# Patient Record
Sex: Female | Born: 1950 | Race: White | Hispanic: No | Marital: Single | Smoking: Never smoker
Health system: Southern US, Community
[De-identification: ages and names within clinical notes are randomized; demographics above are authoritative.]

## PROBLEM LIST (undated history)

## (undated) HISTORY — PX: CHOLECYSTECTOMY: SHX55

---

## 1982-08-08 HISTORY — PX: SHOULDER SURGERY: SHX246

## 1993-08-08 HISTORY — PX: KNEE SURGERY: SHX244

## 2009-08-09 LAB — HM COLONOSCOPY

## 2011-01-01 ENCOUNTER — Emergency Department: Payer: Self-pay | Admitting: Emergency Medicine

## 2011-05-09 LAB — HM MAMMOGRAPHY: HM Mammogram: NORMAL

## 2014-09-25 LAB — HM PAP SMEAR: HM Pap smear: NEGATIVE

## 2015-01-27 ENCOUNTER — Other Ambulatory Visit: Payer: Self-pay | Admitting: Internal Medicine

## 2015-01-27 ENCOUNTER — Ambulatory Visit
Admission: RE | Admit: 2015-01-27 | Discharge: 2015-01-27 | Disposition: A | Discharge status: Home / Self Care | Payer: Federal, State, Local not specified - PPO | Source: Ambulatory Visit | Attending: Internal Medicine | Admitting: Internal Medicine

## 2015-01-27 ENCOUNTER — Ambulatory Visit (INDEPENDENT_AMBULATORY_CARE_PROVIDER_SITE_OTHER): Payer: Federal, State, Local not specified - PPO | Admitting: Internal Medicine

## 2015-01-27 ENCOUNTER — Encounter: Payer: Self-pay | Admitting: Internal Medicine

## 2015-01-27 VITALS — BP 120/64 | HR 77 | Temp 98.2°F | Ht 65.0 in | Wt 242.6 lb

## 2015-01-27 DIAGNOSIS — J189 Pneumonia, unspecified organism: Secondary | ICD-10-CM

## 2015-01-27 DIAGNOSIS — R059 Cough, unspecified: Secondary | ICD-10-CM

## 2015-01-27 DIAGNOSIS — Q792 Exomphalos: Secondary | ICD-10-CM | POA: Insufficient documentation

## 2015-01-27 DIAGNOSIS — R05 Cough: Secondary | ICD-10-CM | POA: Insufficient documentation

## 2015-01-27 DIAGNOSIS — R55 Syncope and collapse: Secondary | ICD-10-CM | POA: Insufficient documentation

## 2015-01-27 DIAGNOSIS — IMO0002 Reserved for concepts with insufficient information to code with codable children: Secondary | ICD-10-CM | POA: Insufficient documentation

## 2015-01-27 DIAGNOSIS — E785 Hyperlipidemia, unspecified: Secondary | ICD-10-CM | POA: Insufficient documentation

## 2015-01-27 DIAGNOSIS — Z86010 Personal history of colonic polyps: Secondary | ICD-10-CM | POA: Insufficient documentation

## 2015-01-27 MED ORDER — AMOXICILLIN-POT CLAVULANATE 875-125 MG PO TABS: 1.0000 | ORAL_TABLET | Freq: Two times a day (BID) | ORAL | Status: DC

## 2015-01-27 MED ORDER — BUDESONIDE 180 MCG/ACT IN AEPB: 1.0000 | INHALATION_SPRAY | Freq: Two times a day (BID) | RESPIRATORY_TRACT | Status: AC

## 2015-01-27 NOTE — Progress Notes (Signed)
Date:  01/27/2015   Name:  Gina Rosario   DOB:  1950/09/07   MRN:  [id number]   Chief Complaint: Cough Cough This is a new problem. The current episode started more than 1 month ago. The problem has been unchanged. The problem occurs every few hours. The cough is non-productive. Associated symptoms include chest pain. Pertinent negatives include no chills, fever, nasal congestion, postnasal drip, rhinorrhea, sore throat, shortness of breath or wheezing. The symptoms are aggravated by dust (talking worsens it). Risk factors for lung disease include occupational exposure (dust at home with some recent remodeling). Her past medical history is significant for environmental allergies. There is no history of asthma or COPD.     Review of Systems:  Review of Systems  Constitutional: Negative for fever and chills.  HENT: Negative for facial swelling, postnasal drip, rhinorrhea, sinus pressure, sneezing and sore throat.   Respiratory: Positive for cough and chest tightness. Negative for choking, shortness of breath and wheezing.   Cardiovascular: Positive for chest pain.  Gastrointestinal: Negative for nausea and diarrhea.  Allergic/Immunologic: Positive for environmental allergies.    Patient Active Problem List   Diagnosis Date Noted  . Adult BMI 30+ 01/27/2015  . Dyslipidemia 01/27/2015  . History of colon polyps 01/27/2015  . Episode of syncope 01/27/2015  . 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 Authorizing Provider  aspirin 81 MG chewable tablet Chew 1 tablet by mouth daily.    Historical Provider, MD    Allergies no known allergies  Past Surgical History  Procedure Laterality Date  . Cholecystectomy    . Shoulder surgery Right 1984    arthroscopic  . Knee surgery  1995    arthroscopic    History  Substance Use Topics  . Smoking status: Never Smoker   . Smokeless tobacco: Not on file  . Alcohol Use: 1.2  oz/week    2 Standard drinks or equivalent per week     Medication list has been reviewed and updated.  Physical Examination:  Physical Exam  Constitutional: She is oriented to person, place, and time. She appears well-developed and well-nourished.  HENT:  Right Ear: No drainage. Tympanic membrane is not erythematous.  Left Ear: No drainage. Tympanic membrane is not erythematous.  Nose: Right sinus exhibits no maxillary sinus tenderness and no frontal sinus tenderness. Left sinus exhibits no maxillary sinus tenderness and no frontal sinus tenderness.  Mouth/Throat: Uvula is midline and oropharynx is clear and moist. No oral lesions. No oropharyngeal exudate or posterior oropharyngeal edema.  Eyes: Conjunctivae are normal.  Neck: Normal range of motion. Neck supple. No tracheal tenderness present. Carotid bruit is not present. No thyromegaly present.  Cardiovascular: Normal rate, regular rhythm and S1 normal.   Pulmonary/Chest: Effort normal. No accessory muscle usage. No respiratory distress. She has decreased breath sounds in the right upper field. She has no wheezes. She has no rhonchi.  Lymphadenopathy:    She has no cervical adenopathy.  Neurological: She is alert and oriented to person, place, and time.    BP 120/64 mmHg  Pulse 77  Temp(Src) 98.2 F (36.8 C)  Ht 5\' 5"  (1.651 m)  Wt 242 lb 9.6 oz (110.043 kg)  BMI 40.37 kg/m2  SpO2 95%  Assessment and Plan: 1. Cough Possible adult onset asthma Begin pulmicort (sample) - DG Chest 2 View; Future - budesonide (PULMICORT) 180 MCG/ACT inhaler; Inhale 1 puff into the lungs 2 (two) times daily.  Dispense: 1 each; Refill: 0   Bari Edward, MD [facility]  01/27/2015

## 2015-03-17 ENCOUNTER — Encounter: Payer: Self-pay | Admitting: Family Medicine

## 2015-03-17 ENCOUNTER — Ambulatory Visit (INDEPENDENT_AMBULATORY_CARE_PROVIDER_SITE_OTHER): Payer: Federal, State, Local not specified - PPO | Admitting: Family Medicine

## 2015-03-17 ENCOUNTER — Ambulatory Visit: Payer: Federal, State, Local not specified - PPO | Admitting: Internal Medicine

## 2015-03-17 ENCOUNTER — Other Ambulatory Visit: Payer: Self-pay | Admitting: Internal Medicine

## 2015-03-17 VITALS — BP 110/70 | HR 64 | Ht 64.0 in | Wt 236.0 lb

## 2015-03-17 DIAGNOSIS — R05 Cough: Secondary | ICD-10-CM

## 2015-03-17 DIAGNOSIS — R059 Cough, unspecified: Secondary | ICD-10-CM

## 2015-03-17 NOTE — Progress Notes (Signed)
Patient informed and agrees with plan.dr

## 2015-03-20 NOTE — Progress Notes (Signed)
Pt not seen.

## 2015-04-14 ENCOUNTER — Encounter: Payer: Self-pay | Admitting: Internal Medicine

## 2015-04-14 NOTE — Progress Notes (Signed)
Mass in left breast needs additional views.

## 2016-05-15 IMAGING — CR DG CHEST 2V
2 series · 2 of 2 positions shown · non-contrast
Comparison: None.

CLINICAL DATA: Cough for 1 month.

EXAM:
CHEST  2 VIEW

[chest pa]
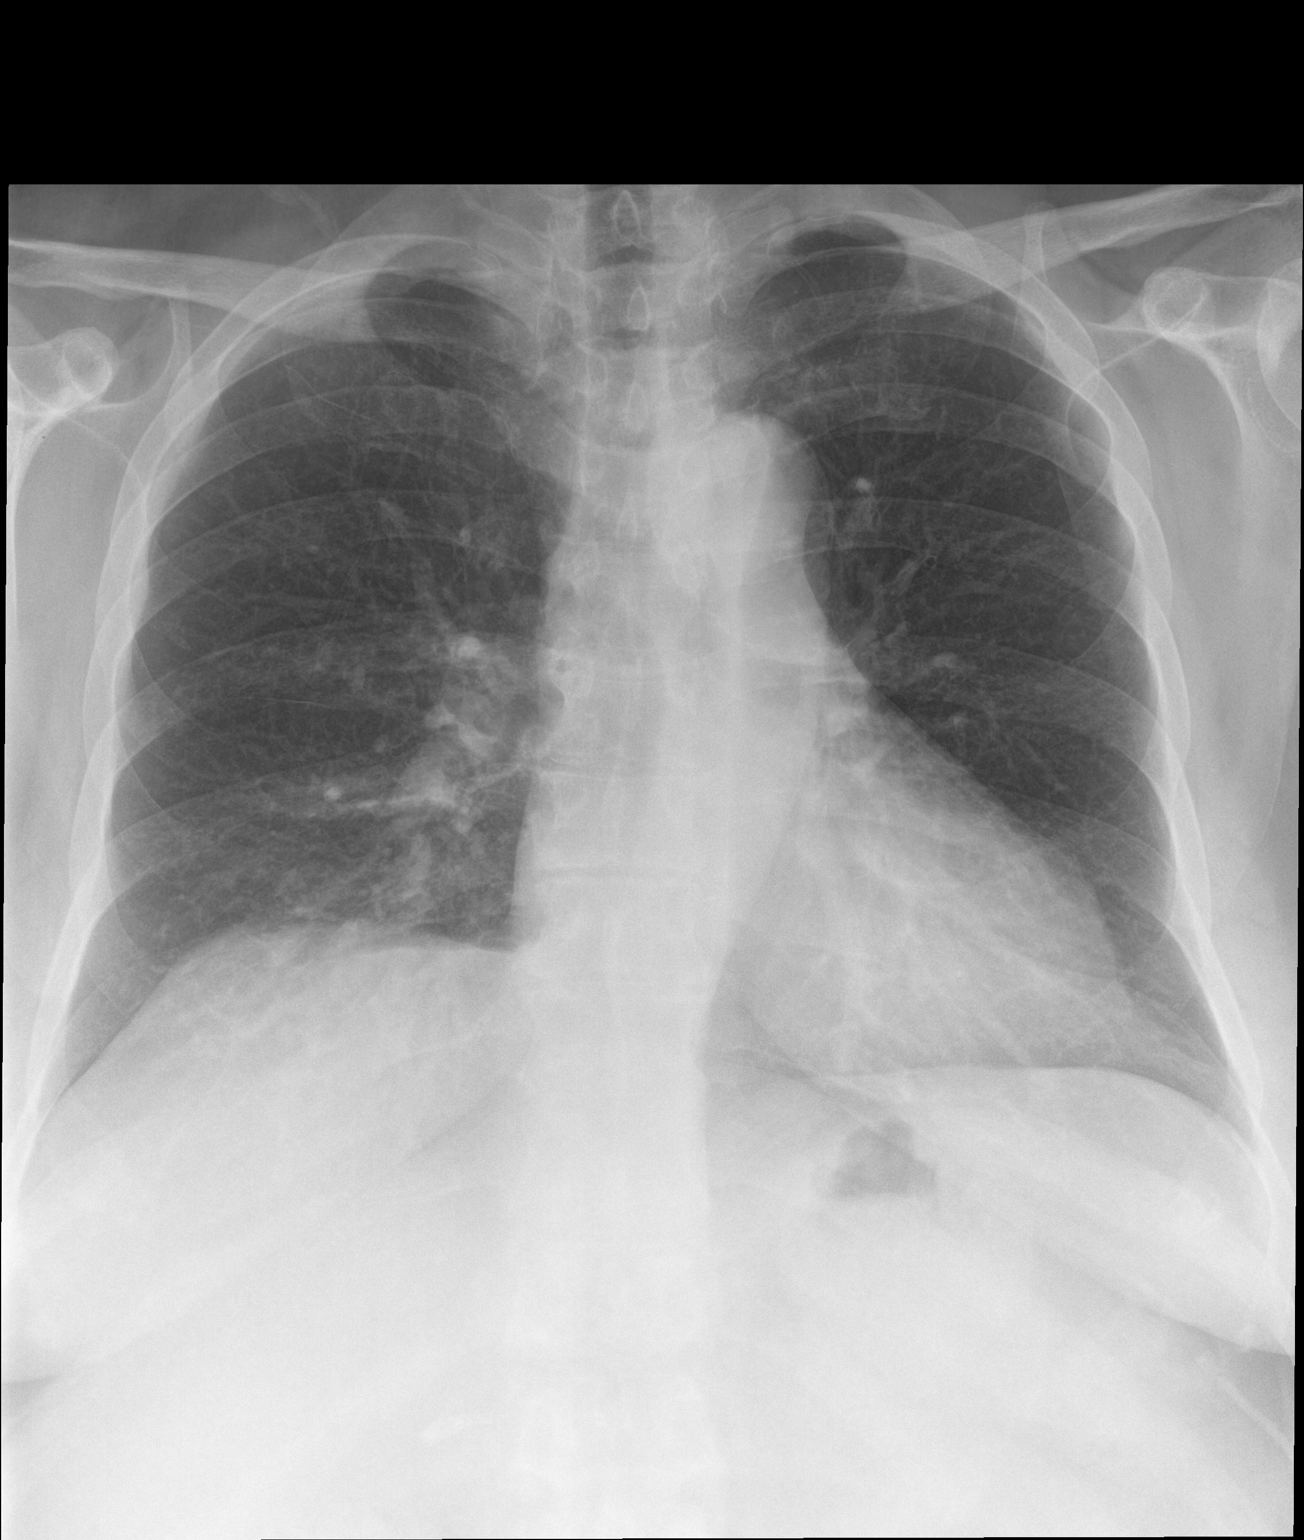

[chest lat]
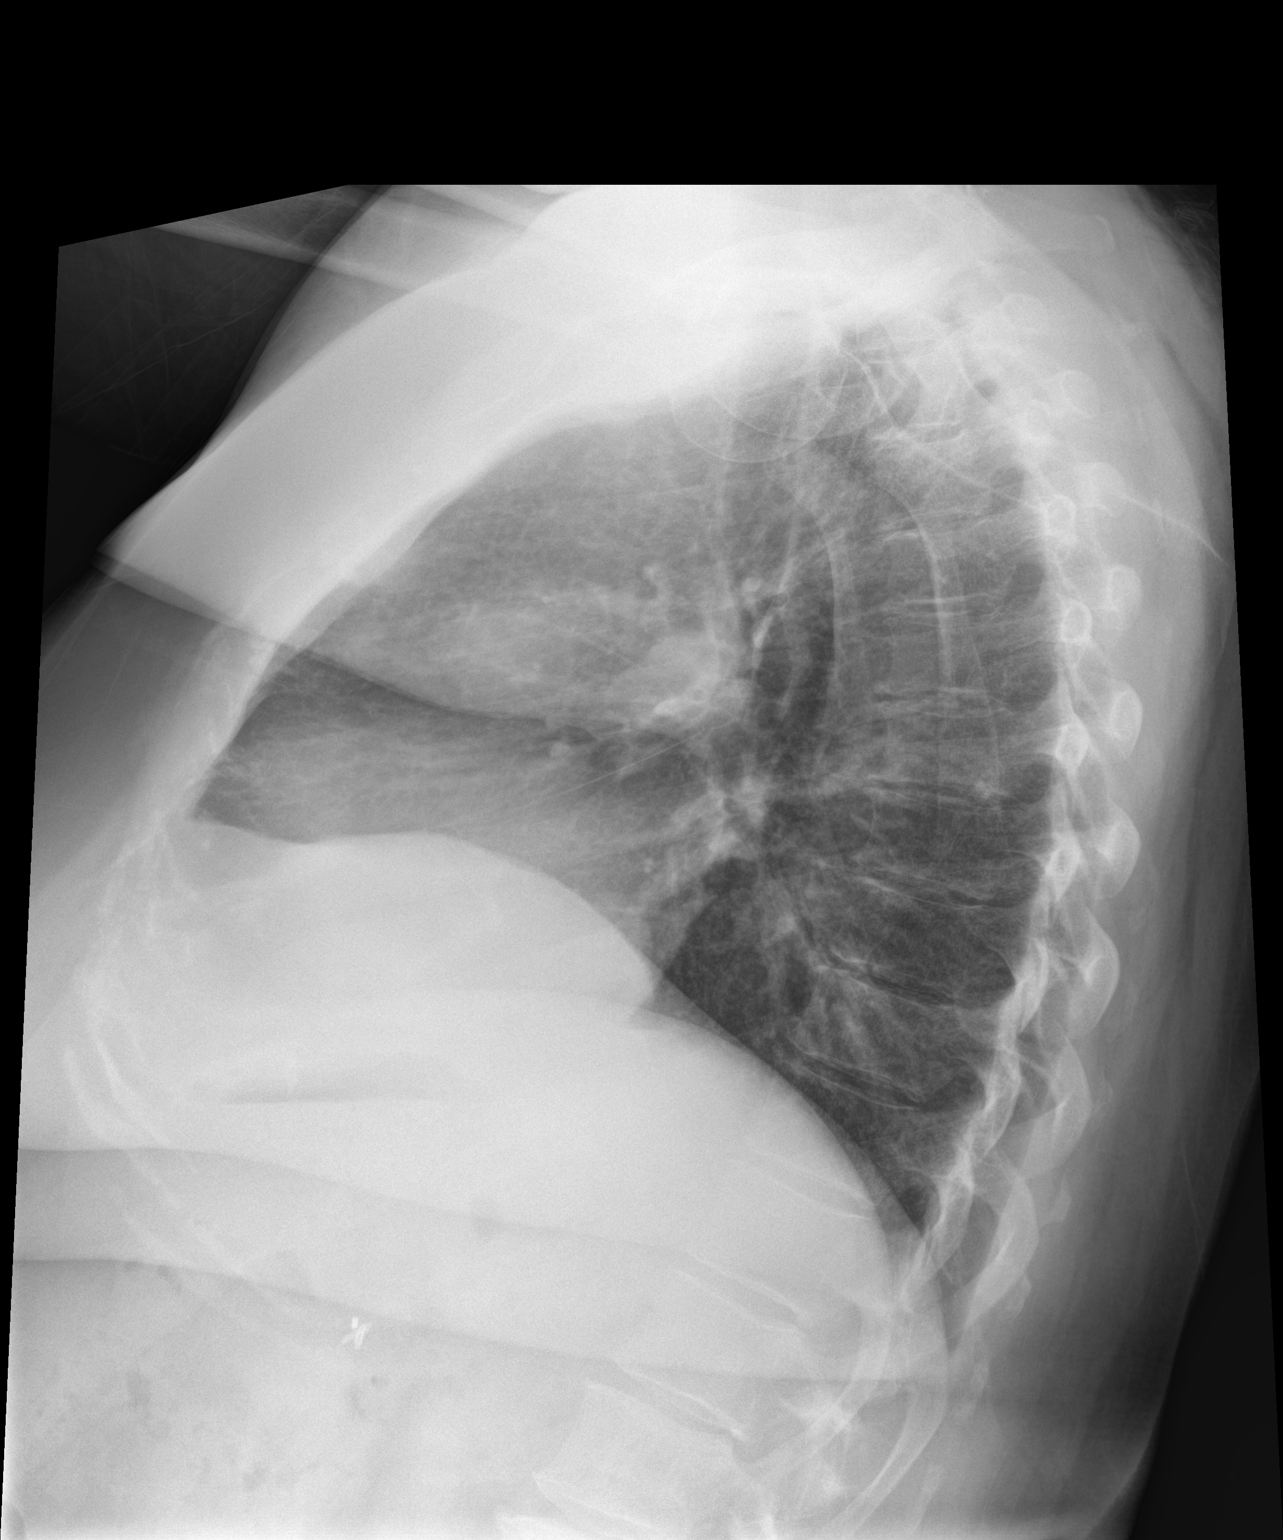

[2 of 2 positions shown; findings below may reference images not displayed]

FINDINGS: The cardiomediastinal silhouette is unremarkable.

Patchy right lower lobe opacity this suspicious for pneumonia.

There is no evidence of pulmonary edema, suspicious pulmonary
nodule/mass, pleural effusion, or pneumothorax. No acute bony
abnormalities are identified.
IMPRESSION: Patchy right lower lobe opacity suspicious for pneumonia.
Radiographic followup to resolution recommended.

## 2016-06-01 DIAGNOSIS — I619 Nontraumatic intracerebral hemorrhage, unspecified: Secondary | ICD-10-CM | POA: Insufficient documentation

## 2016-06-03 DIAGNOSIS — M899 Disorder of bone, unspecified: Secondary | ICD-10-CM | POA: Insufficient documentation

## 2016-06-10 ENCOUNTER — Encounter: Payer: Self-pay | Admitting: Internal Medicine

## 2016-06-10 ENCOUNTER — Other Ambulatory Visit: Payer: Self-pay | Admitting: Internal Medicine

## 2016-06-13 ENCOUNTER — Ambulatory Visit (INDEPENDENT_AMBULATORY_CARE_PROVIDER_SITE_OTHER): Payer: Federal, State, Local not specified - PPO | Admitting: Internal Medicine

## 2016-06-13 ENCOUNTER — Encounter: Payer: Self-pay | Admitting: Internal Medicine

## 2016-06-13 VITALS — BP 122/86 | HR 69 | Resp 16 | Ht 64.0 in | Wt 204.0 lb

## 2016-06-13 DIAGNOSIS — I619 Nontraumatic intracerebral hemorrhage, unspecified: Secondary | ICD-10-CM | POA: Diagnosis not present

## 2016-06-13 DIAGNOSIS — M899 Disorder of bone, unspecified: Secondary | ICD-10-CM | POA: Diagnosis not present

## 2016-06-13 DIAGNOSIS — Z86010 Personal history of colonic polyps: Secondary | ICD-10-CM

## 2016-06-13 DIAGNOSIS — Z1239 Encounter for other screening for malignant neoplasm of breast: Secondary | ICD-10-CM

## 2016-06-13 DIAGNOSIS — Z1231 Encounter for screening mammogram for malignant neoplasm of breast: Secondary | ICD-10-CM

## 2016-06-13 DIAGNOSIS — I69320 Aphasia following cerebral infarction: Secondary | ICD-10-CM | POA: Diagnosis not present

## 2016-06-13 NOTE — Progress Notes (Signed)
Date:  06/13/2016   Name:  Gina Rosario   DOB:  01-15-1951   MRN:  [id number]   Chief Complaint: Cerebrovascular Accident (Hemorragic stroke stayed at hospital 1 week. )  Hospital Course by Problem: copied from [facility] Discharge ICU course: Patient diagnosed with left temporal intracerebral hemorrhage in the emergency room. She was admitted to the intensive care unit for close monitoring. She was seen by the neurology team and neurosurgery teams. Conservative management was carried out. During her evaluation, she was noted to have multiple lytic lesions in her bones. She was seen in consultation by hematology oncology. A bone marrow biopsy was done on October 27. Her ICU stay was grossly unremarkable except for an episode of bradycardia and hypotension October 27 that responded to atropine and IV bolus and was felt to represent a profound vagal episode. She was monitored afterwards and remained in sinus rhythm.  Prior to transfer out of the ICU her neurological evaluation showed subtle expressive aphasia but otherwise no neurological deficits. No antiepileptic therapy recommended by neurology team. She was deemed stable for transfer to medical ward on October 30.  Left temporal lobe intraparenchymal hemorrhage: Present on admission, reason for admission. Etiology of the bleed is unclear as she has no previous history of hypertension. She was seen by neurology service. Conservative management recommended. Antiepileptic medications for prophylaxis were not advised. She was seen by neurosurgery service. Conservative management carried out. Repeat CT scan showed improvement in the size of the bleed. MRI did not show evidence of underlying mass. MRI of the brain needs to be repeated in 6-8 weeks after resolution of the hemorrhage so we can see of any underlying mass is contributing to this bleed. Clinically did well and improved. Seen by PT, OT and SPT, stable for d/c home at this time with 24 hour  supervision. F/u with NSU in 6-8 weeks with repeat MRI brain ordered around that time.  Lytic bony lesions: Noted in the right parietal skull, third right rib, lumbar spine and iliac crest.. Negative SPEP, B56mcroglob not elevated. BM biopsy of iliac crest lesion performed, results pending. Need to plan for age-appropriate screening with mammography and colonoscopy as outpatient - this will need to be coordinated by PCP  Vagal episode Occurred during ICU stay and did not recur since. Single episode of hypotension or bradycardia that did not recur. Felt to be vasovagal episode. Monitored on telemetry afterwards without recurrence. TTE unremarkable.   She reports doing well.  No headache of dizziness.  Her daughter still notices some trouble with word finding but no slurred speech or receptive aphasia.  ST was recommended at discharge. She denies breast problems and is overdue for mammogram. She has no bone pain at any site.  The BM bx was inconclusive according to her daughter.  Patient's mother had MM.  Review of Systems  Constitutional: Positive for fatigue. Negative for chills and unexpected weight change.  HENT: Negative for trouble swallowing and voice change.   Respiratory: Negative for shortness of breath and wheezing.   Cardiovascular: Negative for chest pain.  Gastrointestinal: Positive for constipation. Negative for abdominal distention.  Genitourinary: Negative for difficulty urinating.  Musculoskeletal: Negative for arthralgias.       No bone pain in rib or back; or even in pelvic bx site  Skin: Negative for rash.  Neurological: Negative for dizziness and headaches.  Psychiatric/Behavioral: Negative for dysphoric mood and sleep disturbance. The patient is nervous/anxious.     Patient Active Problem List  Diagnosis Date Noted  . Lytic bone lesions on xray 06/03/2016  . Brain bleed ([place]) 06/01/2016  . Adult BMI 30+ 01/27/2015  . Dyslipidemia 01/27/2015  . History of colon  polyps 01/27/2015  . Episode of syncope 01/27/2015  . 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 Authorizing Provider  acetaminophen (TYLENOL) 325 MG tablet Take by mouth. 06/08/16 06/18/16  Historical Provider, MD  albuterol (PROVENTIL HFA;VENTOLIN HFA) 108 (90 Base) MCG/ACT inhaler Inhale into the lungs. 05/05/16 05/05/17  Historical Provider, MD  aspirin 81 MG chewable tablet Chew 1 tablet by mouth daily.    Historical Provider, MD  budesonide (PULMICORT) 180 MCG/ACT inhaler Inhale 1 puff into the lungs 2 (two) times daily. Patient not taking: Reported on 03/17/2015 01/27/15   Glean Hess, MD  senna-docusate (SENOKOT-S) 8.6-50 MG tablet Take by mouth. 06/08/16 06/08/17  Historical Provider, MD    No Known Allergies  Past Surgical History:  Procedure Laterality Date  . CHOLECYSTECTOMY    . KNEE SURGERY  1995   arthroscopic  . SHOULDER SURGERY Right 1984   arthroscopic    Social History  Substance Use Topics  . Smoking status: Never Smoker  . Smokeless tobacco: Never Used  . Alcohol use 1.2 oz/week    2 Standard drinks or equivalent per week     Medication list has been reviewed and updated.   Physical Exam  Constitutional: She is oriented to person, place, and time. She appears well-developed. No distress.  HENT:  Head: Normocephalic and atraumatic.  Neck: Normal range of motion. Neck supple. No thyromegaly present.  Cardiovascular: Normal rate, regular rhythm and normal heart sounds.   Pulmonary/Chest: Effort normal and breath sounds normal. No respiratory distress.  Musculoskeletal: Normal range of motion. She exhibits no edema or tenderness.  Neurological: She is alert and oriented to person, place, and time. She has normal strength and normal reflexes. No cranial nerve deficit or sensory deficit. Gait normal.  Speech is fairly fluent - very mild hesitancy over certain words.  Skin: Skin is warm, dry and  intact. No rash noted.  Psychiatric: She has a normal mood and affect. Her behavior is normal. Thought content normal.  Nursing note and vitals reviewed.   BP 122/86   Pulse 69   Resp 16   Ht 5' 4"  (1.626 m)   Wt 204 lb (92.5 kg)   SpO2 97%   BMI 35.02 kg/m   Assessment and Plan: 1. Brain bleed ([place]) Recovering well  2. Aphasia as late effect of cerebrovascular accident (CVA) Improving - avoid driving until cleared by [facility]U Will refer to ST at [facility] - Ambulatory referral to Speech Therapy  3. Lytic bone lesions on xray To follow up with Oncology at [facility] for further workup  4. History of colon polyps Missed 5 yr follow up - would avoid heavy sedation for several months post hemorrhagic stroke  5. Breast cancer screening - MM DIGITAL SCREENING BILATERAL; Future   Halina Maidens, MD [facility]  06/13/2016

## 2016-06-13 NOTE — Patient Instructions (Addendum)
06/15/2016 Initial consult Oncology Hardie-Hood, Barkley Boardsobin Lynette, MD  [address]  [facility] [place]  [place], [place] [address]  [phone]  [phone] (Fax)    07/14/2016  Appointment Radiology Bethel BornYarbrough, Chester Kossman, MD  [address]  [facility]  [place], [place] [address]  [phone]  [phone] (Fax)    07/19/2016 Office Visit Neurosurgery Bethel BornYarbrough, Chester Kossman, MD  [address]  [facility]  [place], [place] [address]  [phone]  [phone] (Fax)    12/01/2016 Office Visit Pulmonology Mertie MooresFleming, Herbon E, MD  [id number] [facility] [address]  [facility]  [place], [place] [address]  [phone]  [phone] (Fax)

## 2017-02-05 DEATH — deceased
